# Patient Record
Sex: Male | Born: 1980 | Race: White | Hispanic: No | Marital: Married | Smoking: Former smoker
Health system: Southern US, Community
[De-identification: ages and names within clinical notes are randomized; demographics above are authoritative.]

---

## 2010-02-24 ENCOUNTER — Emergency Department (HOSPITAL_BASED_OUTPATIENT_CLINIC_OR_DEPARTMENT_OTHER): Admission: EM | Admit: 2010-02-24 | Discharge: 2010-02-24 | Payer: Self-pay | Admitting: Emergency Medicine

## 2010-02-24 ENCOUNTER — Ambulatory Visit: Payer: Self-pay | Admitting: Diagnostic Radiology

## 2012-03-17 IMAGING — CR DG FINGER INDEX 2+V*R*
3 series · 3 of 3 positions shown · non-contrast
Comparison: None

CLINICAL DATA: Index finger laceration.

RIGHT INDEX FINGER 2+V

[x finger pa right]
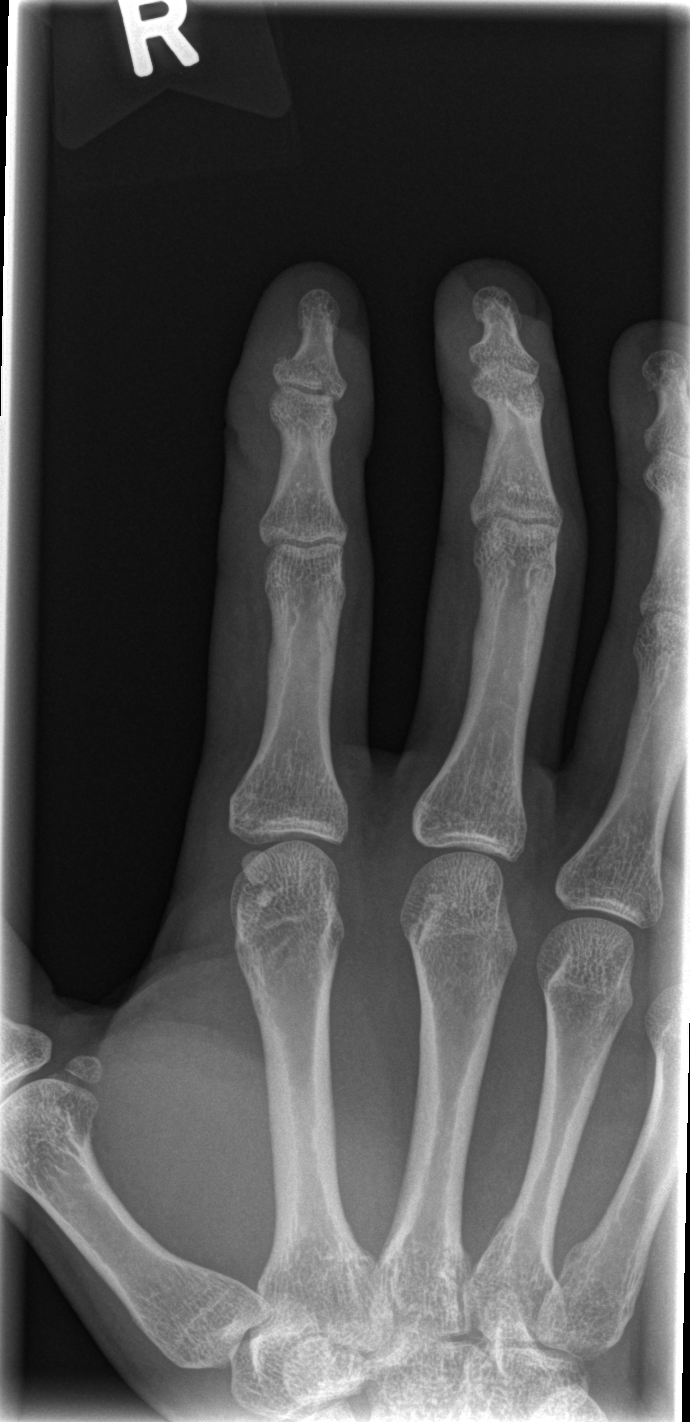

[x finger obl. right]
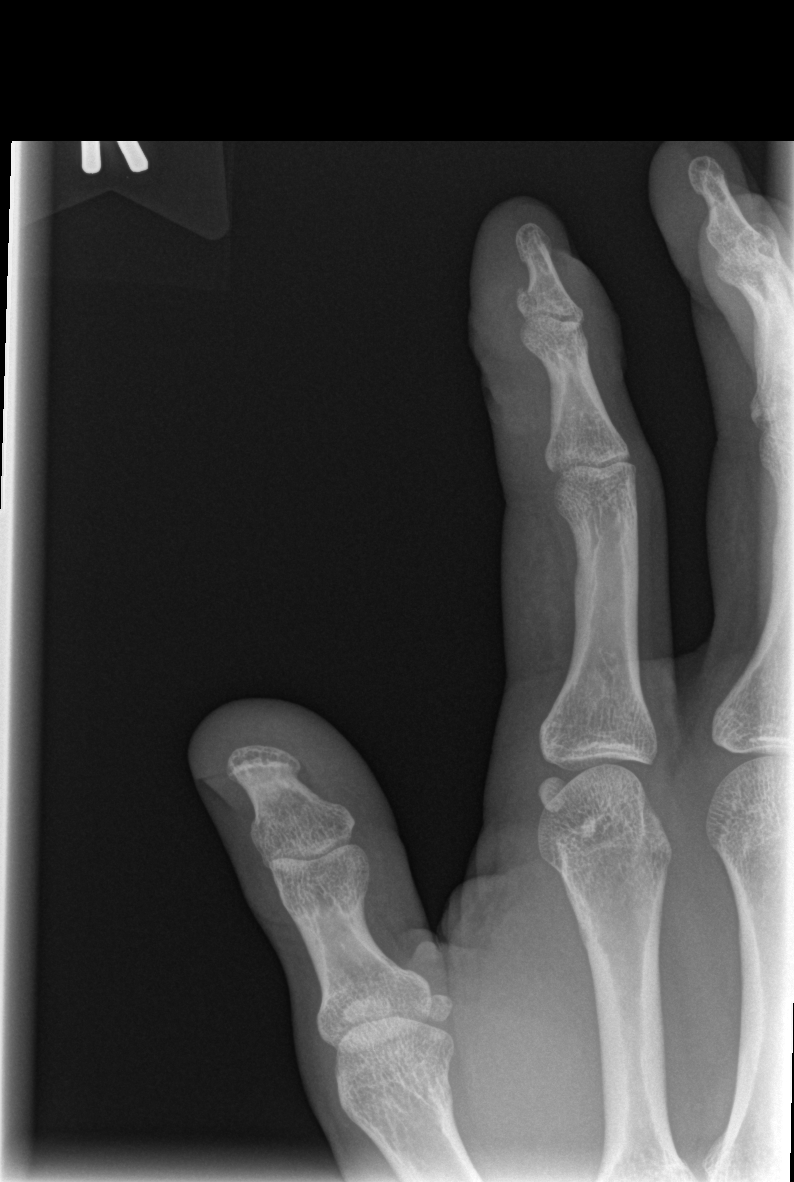

[x finger lateral right]
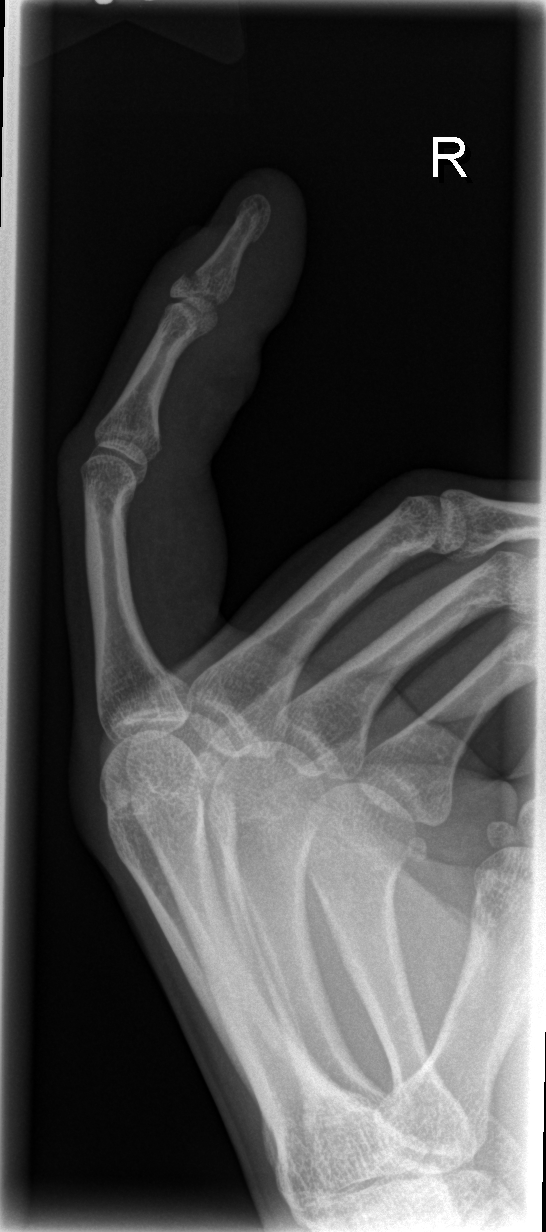

[3 of 3 positions shown; findings below may reference images not displayed]

FINDINGS: Soft tissue laceration noted over the distal aspect of
the right index finger.  There is a fracture noted through the
dorsal aspect of the distal phalanx at the DIP joint.  Mild
displacement.  No additional acute bony abnormality.
IMPRESSION: Fracture at the base of the distal phalanx right index finger
entering the DIP joint.

## 2015-05-26 ENCOUNTER — Encounter (HOSPITAL_COMMUNITY): Payer: Self-pay

## 2015-05-26 ENCOUNTER — Emergency Department (HOSPITAL_COMMUNITY)
Admission: EM | Admit: 2015-05-26 | Discharge: 2015-05-26 | Disposition: A | Discharge status: Home / Self Care | Payer: 59 | Attending: Emergency Medicine | Admitting: Emergency Medicine

## 2015-05-26 DIAGNOSIS — S4990XA Unspecified injury of shoulder and upper arm, unspecified arm, initial encounter: Secondary | ICD-10-CM | POA: Insufficient documentation

## 2015-05-26 DIAGNOSIS — S3991XA Unspecified injury of abdomen, initial encounter: Secondary | ICD-10-CM | POA: Insufficient documentation

## 2015-05-26 DIAGNOSIS — Y998 Other external cause status: Secondary | ICD-10-CM | POA: Diagnosis not present

## 2015-05-26 DIAGNOSIS — S3992XA Unspecified injury of lower back, initial encounter: Secondary | ICD-10-CM | POA: Insufficient documentation

## 2015-05-26 DIAGNOSIS — Y92410 Unspecified street and highway as the place of occurrence of the external cause: Secondary | ICD-10-CM | POA: Diagnosis not present

## 2015-05-26 DIAGNOSIS — Z87891 Personal history of nicotine dependence: Secondary | ICD-10-CM | POA: Insufficient documentation

## 2015-05-26 DIAGNOSIS — Y9389 Activity, other specified: Secondary | ICD-10-CM | POA: Diagnosis not present
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
## 2015-05-26 NOTE — Discharge Instructions (Signed)

## 2015-05-26 NOTE — ED Notes (Signed)
Consent received from patient to authorize [facility] being [facility].  [facility] to let [facility] know [facility] are being [facility].
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
## 2015-05-26 NOTE — ED Notes (Signed)
When pt. Is ready for discharge. [facility]charge summary [facility] prior to [facility]. Going .  Fax number is [phone], Attention, to [place].
